# Patient Record
Sex: Female | Born: 1970 | Hispanic: Yes | Marital: Married
Health system: Southern US, Community
[De-identification: ages and names within clinical notes are randomized; demographics above are authoritative.]

---

## 2007-09-11 ENCOUNTER — Emergency Department: Payer: Self-pay | Admitting: Emergency Medicine

## 2012-03-10 ENCOUNTER — Emergency Department: Payer: Self-pay | Admitting: Emergency Medicine

## 2015-12-24 ENCOUNTER — Other Ambulatory Visit: Payer: Self-pay | Admitting: Obstetrics and Gynecology

## 2015-12-24 DIAGNOSIS — Z1231 Encounter for screening mammogram for malignant neoplasm of breast: Secondary | ICD-10-CM

## 2016-02-02 ENCOUNTER — Ambulatory Visit
Admission: RE | Admit: 2016-02-02 | Discharge: 2016-02-02 | Disposition: A | Discharge status: Home / Self Care | Payer: Self-pay | Source: Ambulatory Visit | Attending: Obstetrics and Gynecology | Admitting: Obstetrics and Gynecology

## 2016-02-02 DIAGNOSIS — Z1231 Encounter for screening mammogram for malignant neoplasm of breast: Secondary | ICD-10-CM

## 2016-02-04 ENCOUNTER — Other Ambulatory Visit: Payer: Self-pay | Admitting: Obstetrics and Gynecology

## 2016-02-04 DIAGNOSIS — R928 Other abnormal and inconclusive findings on diagnostic imaging of breast: Secondary | ICD-10-CM

## 2016-02-16 ENCOUNTER — Other Ambulatory Visit: Payer: Self-pay | Admitting: Obstetrics and Gynecology

## 2016-03-12 ENCOUNTER — Ambulatory Visit
Admission: RE | Admit: 2016-03-12 | Discharge: 2016-03-12 | Disposition: A | Discharge status: Home / Self Care | Payer: Self-pay | Source: Ambulatory Visit | Attending: Obstetrics and Gynecology | Admitting: Obstetrics and Gynecology

## 2016-03-12 DIAGNOSIS — R928 Other abnormal and inconclusive findings on diagnostic imaging of breast: Secondary | ICD-10-CM

## 2016-03-15 ENCOUNTER — Other Ambulatory Visit: Payer: Self-pay | Admitting: Obstetrics and Gynecology

## 2016-03-15 ENCOUNTER — Encounter: Payer: Self-pay | Admitting: *Deleted

## 2016-03-15 DIAGNOSIS — R921 Mammographic calcification found on diagnostic imaging of breast: Secondary | ICD-10-CM

## 2016-03-15 NOTE — Progress Notes (Signed)
Patient had an abnormal mammogram through our [facility].  Left message via the interpreter Annice PihJackie, for the patient to return my call.  Would like to get the patient into our B[facility]CP program for further financial assistance for the recommended biopsy.

## 2016-03-22 ENCOUNTER — Ambulatory Visit: Payer: Self-pay | Attending: Oncology | Admitting: *Deleted

## 2016-03-22 ENCOUNTER — Other Ambulatory Visit: Payer: Self-pay | Admitting: *Deleted

## 2016-03-22 ENCOUNTER — Encounter: Payer: Self-pay | Admitting: *Deleted

## 2016-03-22 VITALS — BP 124/83 | HR 69 | Temp 97.2°F | Ht 59.84 in | Wt 161.0 lb

## 2016-03-22 DIAGNOSIS — R920 Mammographic microcalcification found on diagnostic imaging of breast: Secondary | ICD-10-CM

## 2016-03-22 NOTE — Progress Notes (Signed)
Subjective:     Patient ID: Noni SaupeAbigail Tinoco Gutierrez, female   DOB: 02-09-1971, 45 y.o.   MRN: [id number]  HPI   Review of Systems     Objective:   Physical Exam  Pulmonary/Chest: Right breast exhibits no inverted nipple, no mass, no nipple discharge, no skin change and no tenderness. Left breast exhibits mass. Left breast exhibits no inverted nipple, no nipple discharge, no skin change and no tenderness. Breasts are symmetrical.         Assessment:     45 year old Hispanic female had a birads 4 mammogram through our [facility].  Referred to [facility]CP for financial assistance for her biopsy.  Presents today for clinical breast exam and appointment for biopsy.  Maritza, the interpreter present during the interview and exam.  On clinical breast exam there is an asymmetrical tender thickening at 1-2:00 left breast.  Taught self breast awareness. Family history of breast cancer in a maternal aunt.  Patient has been screened for eligibility.  She does not have any insurance, Medicare or Medicaid.  She also meets financial eligibility.  Hand-out given on the Affordable Care Act.     Plan:     Order placed for steriotactic biopsy of the left breast.  Appointment scheduled for June 5th at 8:00 for her biopsy per patients request for a Monday.  Will follow-up BCCCP protocol.

## 2016-03-22 NOTE — Patient Instructions (Signed)
Gave patient hand-out, Women Staying Healthy, Active and Well from BCCCP, with education on breast health, pap smears, heart and colon health. 

## 2016-04-05 ENCOUNTER — Ambulatory Visit
Admission: RE | Admit: 2016-04-05 | Discharge: 2016-04-05 | Disposition: A | Discharge status: Home / Self Care | Payer: Self-pay | Source: Ambulatory Visit | Attending: Oncology | Admitting: Oncology

## 2016-04-05 DIAGNOSIS — R920 Mammographic microcalcification found on diagnostic imaging of breast: Secondary | ICD-10-CM

## 2016-04-05 HISTORY — PX: BREAST BIOPSY: SHX20

## 2016-04-06 ENCOUNTER — Encounter: Payer: Self-pay | Admitting: *Deleted

## 2016-04-06 LAB — SURGICAL PATHOLOGY

## 2016-04-06 NOTE — Progress Notes (Signed)
Patient returned my call.  With the assistance of Lloyda, the interpreter informed patient of her benign path results.  Explained need to return in 6 months for another mammogram of the left breast.  Informed I would send her a letter with the appointment.

## 2016-04-06 NOTE — Progress Notes (Signed)
[facility] the interpreter for the patient to return my call.

## 2016-04-06 NOTE — Progress Notes (Signed)
Sent request for an interpreter to assist with calling patient to give her her benign pathology results.

## 2016-04-15 ENCOUNTER — Other Ambulatory Visit: Payer: Self-pay | Admitting: *Deleted

## 2016-04-15 ENCOUNTER — Encounter: Payer: Self-pay | Admitting: *Deleted

## 2016-04-15 DIAGNOSIS — R920 Mammographic microcalcification found on diagnostic imaging of breast: Secondary | ICD-10-CM

## 2016-04-15 NOTE — Progress Notes (Signed)
Mailed letter to inform patient of her appointment for her post biopsy 6 month f/u mammogram on 10/22/16 @ 11:00.

## 2016-10-20 ENCOUNTER — Ambulatory Visit: Payer: Self-pay | Attending: Oncology

## 2016-10-20 ENCOUNTER — Ambulatory Visit
Admission: RE | Admit: 2016-10-20 | Discharge: 2016-10-20 | Disposition: A | Discharge status: Home / Self Care | Payer: Self-pay | Source: Ambulatory Visit | Attending: Internal Medicine | Admitting: Internal Medicine

## 2016-10-20 DIAGNOSIS — R920 Mammographic microcalcification found on diagnostic imaging of breast: Secondary | ICD-10-CM

## 2016-10-22 ENCOUNTER — Other Ambulatory Visit: Payer: Self-pay

## 2016-10-22 ENCOUNTER — Ambulatory Visit: Payer: Self-pay

## 2016-10-22 NOTE — Progress Notes (Signed)
Per radiologist, patient to return to annual screening mammogram in 6 months. Scheduled for BCCCP clinic appointment on 04/20/2017.  Letter mailed to notify patient of appointment date, and time.  Copy to [facility].

## 2017-04-20 ENCOUNTER — Ambulatory Visit: Payer: Self-pay | Attending: Oncology

## 2018-05-28 ENCOUNTER — Other Ambulatory Visit: Payer: Self-pay

## 2018-05-28 DIAGNOSIS — R079 Chest pain, unspecified: Secondary | ICD-10-CM | POA: Insufficient documentation

## 2018-05-28 DIAGNOSIS — N644 Mastodynia: Secondary | ICD-10-CM | POA: Insufficient documentation

## 2018-05-28 LAB — COMPREHENSIVE METABOLIC PANEL
ALK PHOS: 72 U/L (ref 38–126)
ALT: 33 U/L (ref 0–44)
ANION GAP: 7 (ref 5–15)
AST: 29 U/L (ref 15–41)
Albumin: 4 g/dL (ref 3.5–5.0)
BILIRUBIN TOTAL: 0.3 mg/dL (ref 0.3–1.2)
BUN: 11 mg/dL (ref 6–20)
CALCIUM: 8.8 mg/dL — AB (ref 8.9–10.3)
CO2: 26 mmol/L (ref 22–32)
Chloride: 104 mmol/L (ref 98–111)
Creatinine, Ser: 0.57 mg/dL (ref 0.44–1.00)
GFR calc non Af Amer: >60 mL/min (ref >60)
Glucose, Bld: 131 mg/dL — ABNORMAL HIGH (ref 70–99)
Potassium: 3.9 mmol/L (ref 3.5–5.1)
SODIUM: 137 mmol/L (ref 135–145)
TOTAL PROTEIN: 7.7 g/dL (ref 6.5–8.1)

## 2018-05-28 LAB — CBC
HCT: 36.2 % (ref 35.0–47.0)
Hemoglobin: 12.6 g/dL (ref 12.0–16.0)
MCH: 30.3 pg (ref 26.0–34.0)
MCHC: 34.9 g/dL (ref 32.0–36.0)
MCV: 86.8 fL (ref 80.0–100.0)
Platelets: 279 10*3/uL (ref 150–440)
RBC: 4.17 MIL/uL (ref 3.80–5.20)
RDW: 12.7 % (ref 11.5–14.5)
WBC: 6.6 10*3/uL (ref 3.6–11.0)

## 2018-05-28 LAB — TROPONIN I: Troponin I: <0.03 ng/mL (ref <0.03)

## 2018-05-28 NOTE — ED Triage Notes (Signed)
Patient now shows RN a "boil on her breast." Nipple is enlarged. States she had a mammogram and it "shows no cancer."

## 2018-05-28 NOTE — ED Triage Notes (Signed)
Through interpretor, Patient to ED for chest pain since Friday. Took Tylenol without relief. Denies N/V/D. Currently able to speak in complete sentences without difficulty. States the pain radiates up into the left shoulder. Was going to clinic in the morning but started hurting her again so came here instead.

## 2018-05-29 ENCOUNTER — Emergency Department
Admission: EM | Admit: 2018-05-29 | Discharge: 2018-05-29 | Disposition: A | Discharge status: Home / Self Care | Payer: Self-pay | Attending: Emergency Medicine | Admitting: Emergency Medicine

## 2018-05-29 ENCOUNTER — Emergency Department: Payer: Self-pay

## 2018-05-29 DIAGNOSIS — R079 Chest pain, unspecified: Secondary | ICD-10-CM

## 2018-05-29 DIAGNOSIS — N644 Mastodynia: Secondary | ICD-10-CM

## 2018-05-29 LAB — FIBRIN DERIVATIVES D-DIMER (ARMC ONLY): FIBRIN DERIVATIVES D-DIMER (ARMC): 276.65 ng{FEU}/mL (ref 0.00–499.00)

## 2018-05-29 LAB — TROPONIN I: Troponin I: <0.03 ng/mL (ref <0.03)

## 2018-05-29 MED ORDER — NAPROXEN 500 MG PO TABS: 500.0000 mg | ORAL_TABLET | Freq: Two times a day (BID) | ORAL | 0 refills | Status: AC

## 2018-05-29 MED ORDER — KETOROLAC TROMETHAMINE 30 MG/ML IJ SOLN
10.0000 mg | Freq: Once | INTRAMUSCULAR | Status: AC
  Administered 2018-05-29: 9.9 mg via INTRAVENOUS
  Filled 2018-05-29: qty 1

## 2018-05-29 NOTE — Discharge Instructions (Signed)
1.  You may take Naprosyn as needed for pain. 2.  Speak with your doctor about getting a breast ultrasound to evaluate the tiny nodules in your left breast. 3.  Return to the ER for worsening symptoms, persistent vomiting, difficulty breathing or other concerns.

## 2018-05-29 NOTE — ED Notes (Signed)
ED Provider at bedside. 

## 2018-05-29 NOTE — ED Notes (Signed)
Patient c/o left chest pain radiating to back X 1 week.

## 2018-05-29 NOTE — ED Notes (Signed)
Reviewed discharge instructions, follow-up care, and prescriptions with patient. Patient verbalized understanding of all information reviewed. Patient stable, with no distress noted at this time.    

## 2018-05-29 NOTE — ED Notes (Signed)
Patient has lump that can be felt on palpation of left breast. Patient is tender to palpation of this area.

## 2018-05-29 NOTE — ED Provider Notes (Signed)
[facility] Emergency Department Provider Note   ____________________________________________   First MD Initiated Contact with Patient 05/29/18 [phone]     (approximate)  I have reviewed the triage vital signs and the nursing notes.   HISTORY  Chief Complaint Chest Pain  History obtained via [employer] Spanish interpreter  HPI Sandy Deleon is a 47 y.o. female who presents to the ED from home with a chief complaint of chest pain.  Patient reports chest pain x1 week.  Complains of left chest pain radiating into her back.  Painful on palpation.  Denies associated diaphoresis, shortness of breath, nausea/vomiting, palpitations or dizziness.  Denies recent fever, chills, cough, congestion, abdominal pain, dysuria.  Denies recent travel or trauma.  Denies hormone use.  Past medical history None  There are no active problems to display for this patient.   Past Surgical History:  Procedure Laterality Date  . BREAST BIOPSY Left 04/05/2016   benign    Prior to Admission medications   Medication Sig Start Date End Date Taking? Authorizing Provider  naproxen (NAPROSYN) 500 MG tablet Take 1 tablet (500 mg total) by mouth 2 (two) times daily with a meal. 05/29/18   Irean Hong, MD    Allergies Patient has no known allergies.  Family History  Problem Relation Age of Onset  . Uterine cancer Mother     Social History Social History   Tobacco Use  . Smoking status: Not on file  Substance Use Topics  . Alcohol use: Not on file  . Drug use: Not on file  Non-smoker  Review of Systems  Constitutional: No fever/chills Eyes: No visual changes. ENT: No sore throat. Cardiovascular: Positive for chest pain. Respiratory: Denies shortness of breath. Gastrointestinal: No abdominal pain.  No nausea, no vomiting.  No diarrhea.  No constipation. Genitourinary: Negative for dysuria. Musculoskeletal: Negative for back pain. Skin: Negative for  rash. Neurological: Negative for headaches, focal weakness or numbness.   ____________________________________________   PHYSICAL EXAM:  VITAL SIGNS: ED Triage Vitals  Enc Vitals Group     BP 05/29/18 0313 (!) 154/84     Pulse Rate 05/29/18 0313 71     Resp 05/29/18 0313 17     Temp --      Temp src --      SpO2 05/29/18 0313 99 %     Weight 05/28/18 2210 160 lb (72.6 kg)     Height 05/28/18 2210 5' (1.524 m)     Head Circumference --      Peak Flow --      Pain Score 05/28/18 2210 7     Pain Loc --      Pain Edu? --      Excl. in GC? --     Constitutional: Alert and oriented. Well appearing and in no acute distress. Eyes: Conjunctivae are normal. PERRL. EOMI. Head: Atraumatic. Nose: No congestion/rhinnorhea. Mouth/Throat: Mucous membranes are moist.  Oropharynx non-erythematous. Neck: No stridor.   Cardiovascular: Normal rate, regular rhythm. Grossly normal heart sounds.  Good peripheral circulation. Respiratory: Normal respiratory effort.  No retractions. Lungs CTAB. Breasts: Left breast point tender to palpation approximately 2 cm superior lateral to nipple.  No pewter orange.  No nipple discharge.  Tiny mobile nodules palpated. Gastrointestinal: Soft and nontender. No distention. No abdominal bruits. No CVA tenderness. Musculoskeletal: No lower extremity tenderness nor edema.  No joint effusions. Neurologic:  Normal speech and language. No gross focal neurologic deficits are appreciated. No gait instability. Skin:  Skin is warm, dry and intact. No rash noted. Psychiatric: Mood and affect are normal. Speech and behavior are normal.  ____________________________________________   LABS (all labs ordered are listed, but only abnormal results are displayed)  Labs Reviewed  COMPREHENSIVE METABOLIC PANEL - Abnormal; Notable for the following components:      Result Value   Glucose, Bld 131 (*)    Calcium 8.8 (*)    All other components within normal limits  CBC   TROPONIN I  TROPONIN I  FIBRIN DERIVATIVES D-DIMER ([facility] ONLY)   ____________________________________________  EKG  ED ECG REPORT I, Gracyn Allor J, the attending physician, personally viewed and interpreted this ECG.   Date: 05/29/2018  EKG Time: 2210  Rate: 74  Rhythm: normal EKG, normal sinus rhythm  Axis: Normal  Intervals:none  ST&T Change: Nonspecific  ____________________________________________  RADIOLOGY  ED MD interpretation: No acute cardiopulmonary process  Official radiology report(s): Dg Chest 2 View  Result Date: 05/29/2018 CLINICAL DATA:  LEFT chest pain radiating to back for a week. EXAM: CHEST - 2 VIEW COMPARISON:  Chest radiograph Mar 01, 2012 FINDINGS: Cardiomediastinal silhouette is normal. No pleural effusions or focal consolidations. Trachea projects midline and there is no pneumothorax. Soft tissue planes and included osseous structures are non-suspicious. Moderate amount of stool in the included bowel. IMPRESSION: Negative. Electronically Signed   By: Awilda Metroourtnay  Bloomer M.D.   On: 05/29/2018 06:38    ____________________________________________   PROCEDURES  Procedure(s) performed: None  Procedures  Critical Care performed: No  ____________________________________________   INITIAL IMPRESSION / ASSESSMENT AND PLAN / ED COURSE  As part of my medical decision making, I reviewed the following data within the electronic MEDICAL RECORD NUMBER Nursing notes reviewed and incorporated, Labs reviewed, Old chart reviewed, Radiograph reviewed  and Notes from prior ED visits   47 year old Hispanic female who presents with left chest/breast pain x1 week. Differential diagnosis includes, but is not limited to, ACS, aortic dissection, pulmonary embolism, cardiac tamponade, pneumothorax, pneumonia, pericarditis, myocarditis, GI-related causes including esophagitis/gastritis, and musculoskeletal chest wall pain.    Initial EKG and troponin unremarkable.  Will  repeat troponin, check d-dimer, check chest x-ray and reassess.  10 mg IV Toradol given for discomfort.  Clinical Course as of May 29 720  Mon May 29, 2018  [id number] Patient feeling better after IV Toradol.  Updated her on chest x-ray result.  Will discharge home on Naprosyn and patient will follow-up with her PCP.  I have asked her to speak with her PCP about obtaining a breast ultrasound.  Return precautions given.  Patient verbalizes understanding and agrees with plan of care.   [JS]    Clinical Course User Index [JS] Irean HongSung, Talita Recht J, MD     ____________________________________________   FINAL CLINICAL IMPRESSION(S) / ED DIAGNOSES  Final diagnoses:  Nonspecific chest pain  Breast pain in female     ED Discharge Orders        Ordered    naproxen (NAPROSYN) 500 MG tablet  2 times daily with meals     05/29/18 0550       Note:  This document was prepared using Dragon voice recognition software and may include unintentional dictation errors.    Irean HongSung, Jeran Hiltz J, MD 05/29/18 [id number]

## 2018-06-21 ENCOUNTER — Ambulatory Visit: Payer: Self-pay | Attending: Oncology | Admitting: *Deleted

## 2018-06-21 ENCOUNTER — Encounter: Payer: Self-pay | Admitting: *Deleted

## 2018-06-21 ENCOUNTER — Other Ambulatory Visit: Payer: Self-pay

## 2018-06-21 VITALS — BP 127/85 | HR 79 | Temp 98.3°F | Ht 60.0 in | Wt 169.0 lb

## 2018-06-21 DIAGNOSIS — N644 Mastodynia: Secondary | ICD-10-CM

## 2018-06-21 NOTE — Progress Notes (Signed)
  Subjective:     Patient ID: Sandy SaupeAbigail Tinoco Deleon, female   DOB: 04-19-1971, 47 y.o.   MRN: [id number]  HPI   Review of Systems     Objective:   Physical Exam  Pulmonary/Chest:         Assessment:     47 year old Hispanic female referred to [facility] by [facility]illary [facility]adenopathy.  [name], number [phone] from the language line was  used for interpretation.  Interpretation was very difficult using the language line interpreter.  The questions I asked were not always answered appropriately. The patient would even answer me occasionally in English after multiple times asking the interpreter for clarification.  Patient states she presented to the Emergency room about 3 weeks ago for left breast pain.  She states she got pain meds in the ED and was much better after a week.  States she still has breast pain, but "it's better".  Patient was unable to rate the pain, or give any info regarding aggravating or alleviating factors.  She did describe the pain as "burning".  On clinical breast exam there are 3 red insect bite like lesions on the left breast.  There is tender glandular like tissue lateral to 2 bites at the 3:00 area of the left breast.  Patient showed me a picture of her breast before she went to the ED.  Her breast was very red around the 2 insect bites at the 3:00 area.  The left breast is still tender to palpation.  Family history of cancer includes mom with uterine cancer and a maternal aunt with breast cancer.  Taught self breast awareness.  Last pap on 02/22/17 was negative without HPV co-testing. Patient has been screened for eligibility.  She does not have any insurance, Medicare or Medicaid.  She also meets financial eligibility.  Hand-out given on the Affordable Care Act.Risk Assessment    Risk Scores     06/21/2018  Last edited by: Scarlett PrestoShaver, Anne F, RN  5-year risk: 0.6 %  Lifetime risk: 5.9 %        Plan:     Bilateral diagnostic mammogram  and ultrasound ordered.  Joellyn Quailshristy Burton to schedule patient.  Will follow-up per BCCCP protocol.

## 2018-06-21 NOTE — Patient Instructions (Signed)
Gave patient hand-out, Women Staying Healthy, Active and Well from BCCCP, with education on breast health, pap smears, heart and colon health. 

## 2018-06-26 ENCOUNTER — Other Ambulatory Visit: Payer: Self-pay | Admitting: *Deleted

## 2018-06-26 ENCOUNTER — Ambulatory Visit
Admission: RE | Admit: 2018-06-26 | Discharge: 2018-06-26 | Disposition: A | Discharge status: Home / Self Care | Payer: Self-pay | Source: Ambulatory Visit | Attending: Oncology | Admitting: Oncology

## 2018-06-26 DIAGNOSIS — N644 Mastodynia: Secondary | ICD-10-CM

## 2018-06-27 ENCOUNTER — Encounter: Payer: Self-pay | Admitting: *Deleted

## 2018-06-27 NOTE — Progress Notes (Signed)
Talked to patient today via the interpreter Greig CastillaAndrew.  Reviewed patient's mammogram with multiple bilateral tiny cysts noted.  States she only has pain when she touches the area.  Patient states only occasional caffeine use.  She was encouraged to call back if she has any changes, otherwise next mammogram in 1 year.  [facility] to Oleanhristy.

## 2018-08-02 ENCOUNTER — Ambulatory Visit: Payer: Self-pay

## 2020-02-21 IMAGING — CR DG CHEST 2V
2 series · 2 of 2 positions shown · non-contrast
Comparison: Chest radiograph March 01, 2012

CLINICAL DATA: LEFT chest pain radiating to back for a week.

EXAM:
CHEST - 2 VIEW

[chest pa]
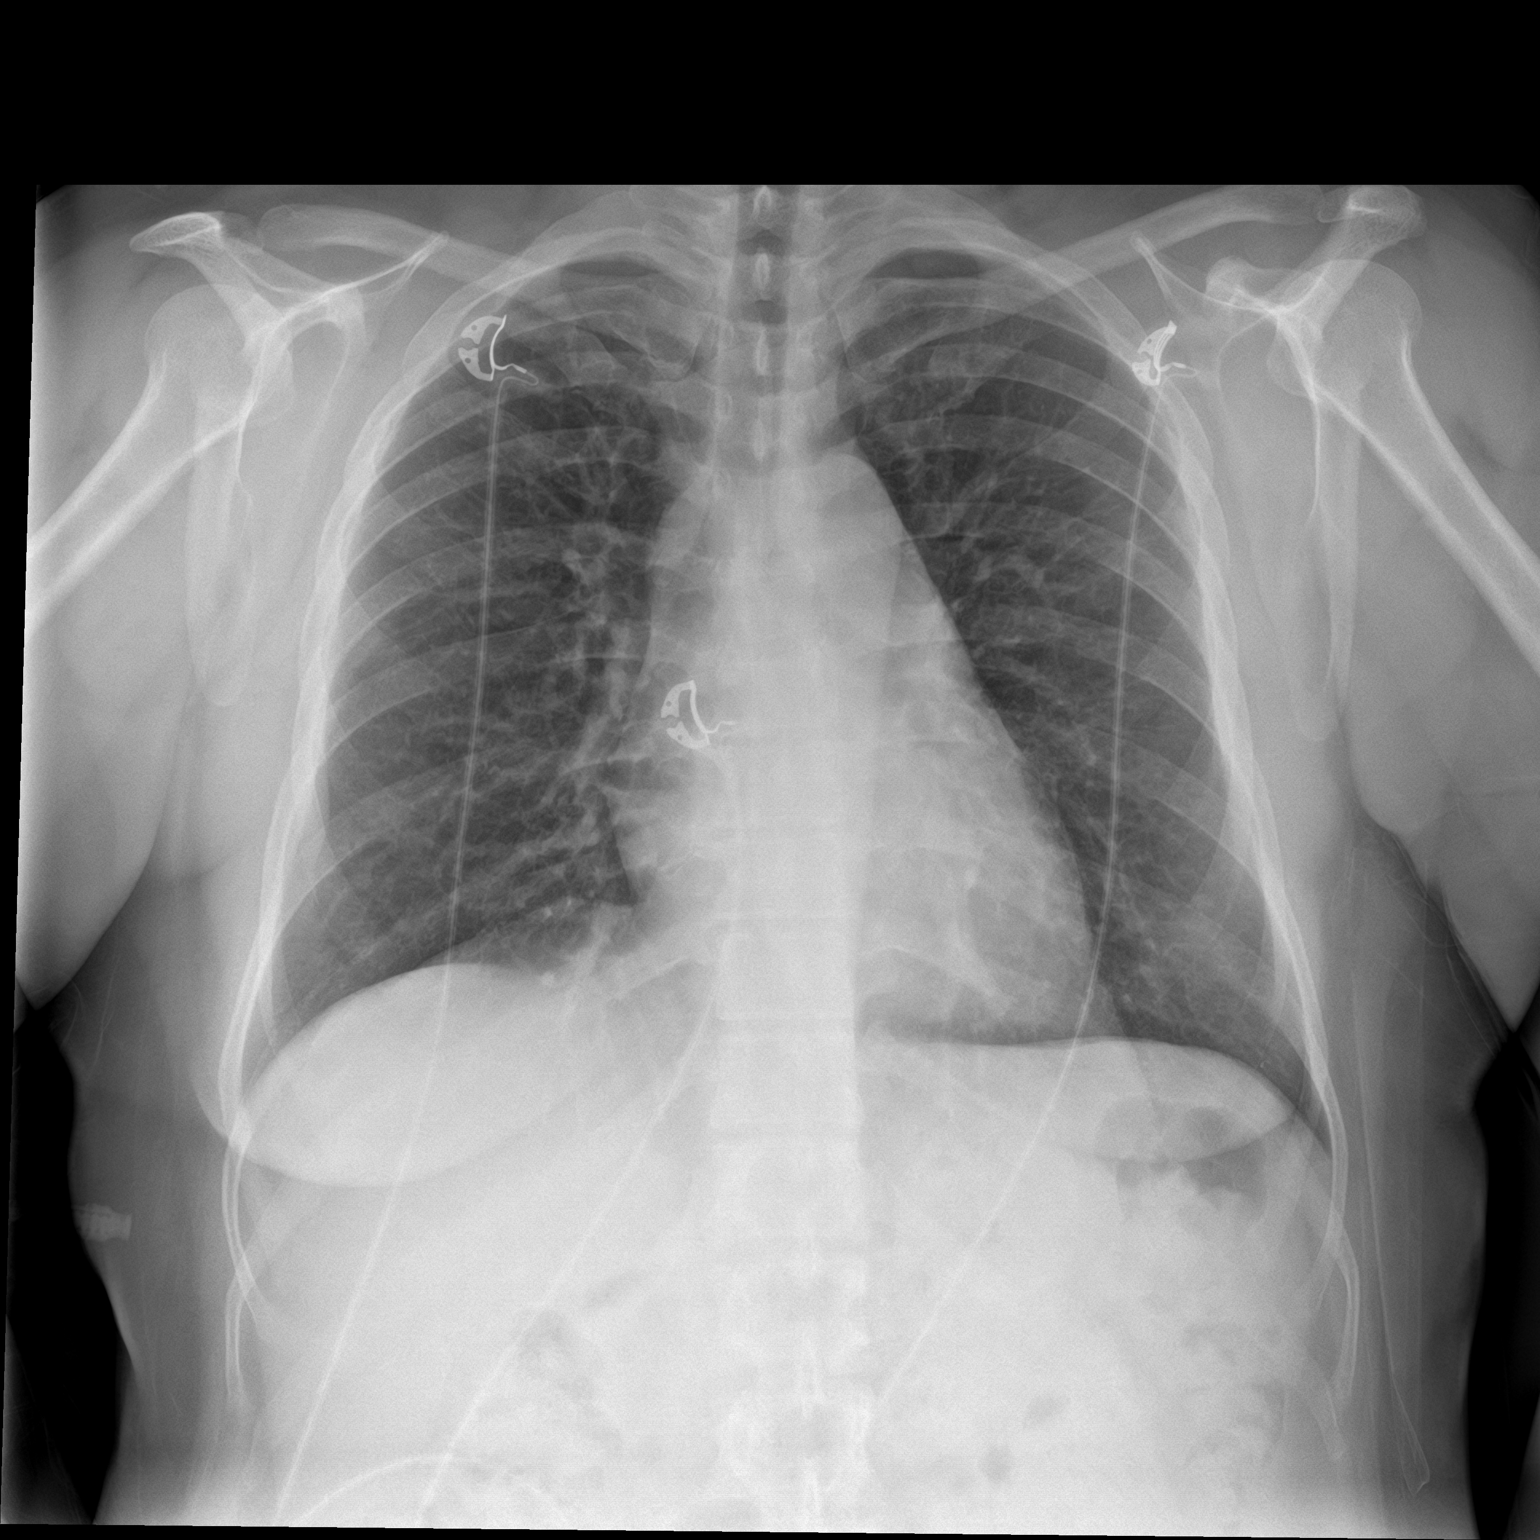

[chest lat]
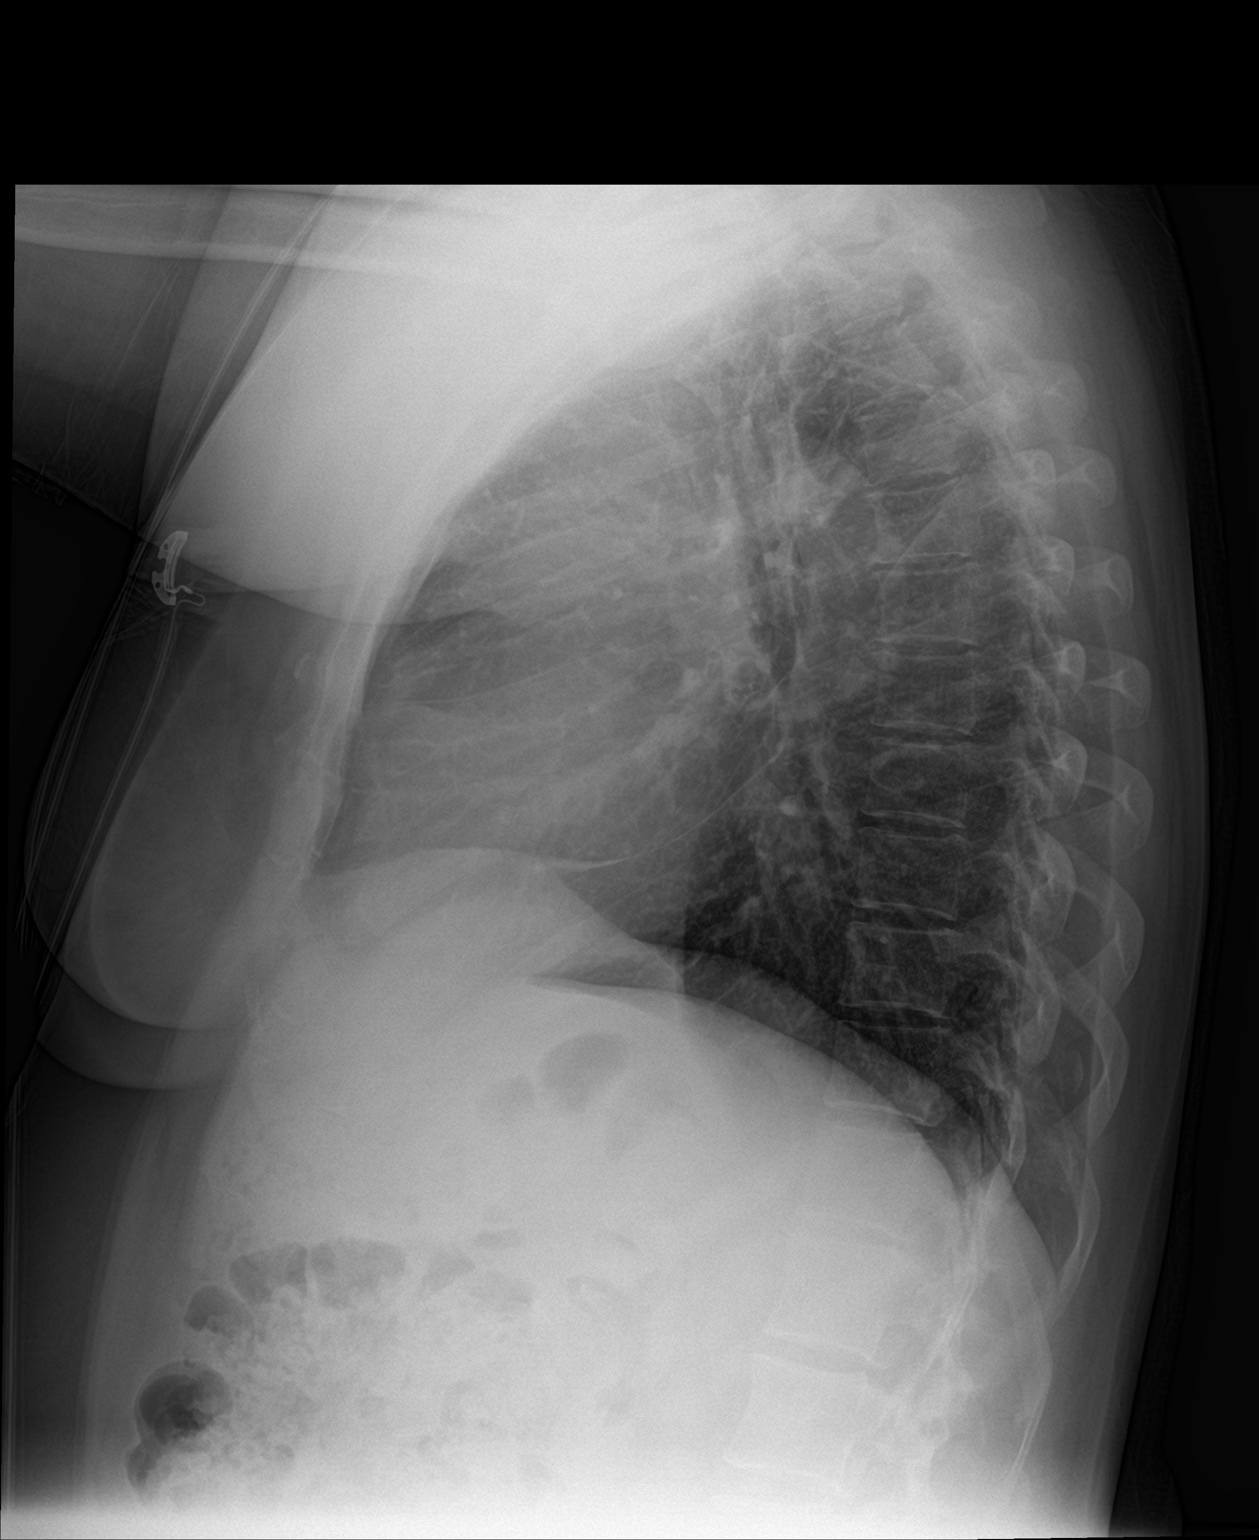

[2 of 2 positions shown; findings below may reference images not displayed]

FINDINGS: Cardiomediastinal silhouette is normal. No pleural effusions or
focal consolidations. Trachea projects midline and there is no
pneumothorax. Soft tissue planes and included osseous structures are
non-suspicious. Moderate amount of stool in the included bowel.
IMPRESSION: Negative.

## 2024-08-01 ENCOUNTER — Telehealth: Payer: Self-pay

## 2024-08-01 ENCOUNTER — Encounter: Payer: Self-pay | Admitting: Family Medicine

## 2024-11-29 LAB — COLOGUARD: COLOGUARD: NEGATIVE
